# Patient Record
Sex: Female | Born: 1935 | Race: White | Hispanic: No | Marital: Married | Smoking: Former smoker
Health system: Southern US, Community
[De-identification: ages and names within clinical notes are randomized; demographics above are authoritative.]

## PROBLEM LIST (undated history)

## (undated) DIAGNOSIS — N6489 Other specified disorders of breast: Secondary | ICD-10-CM

## (undated) DIAGNOSIS — N390 Urinary tract infection, site not specified: Secondary | ICD-10-CM

## (undated) DIAGNOSIS — K219 Gastro-esophageal reflux disease without esophagitis: Secondary | ICD-10-CM

## (undated) DIAGNOSIS — Z974 Presence of external hearing-aid: Secondary | ICD-10-CM

## (undated) DIAGNOSIS — J189 Pneumonia, unspecified organism: Secondary | ICD-10-CM

## (undated) DIAGNOSIS — J449 Chronic obstructive pulmonary disease, unspecified: Secondary | ICD-10-CM

## (undated) DIAGNOSIS — J45909 Unspecified asthma, uncomplicated: Secondary | ICD-10-CM

## (undated) DIAGNOSIS — R112 Nausea with vomiting, unspecified: Secondary | ICD-10-CM

## (undated) DIAGNOSIS — Z973 Presence of spectacles and contact lenses: Secondary | ICD-10-CM

## (undated) DIAGNOSIS — R0600 Dyspnea, unspecified: Secondary | ICD-10-CM

## (undated) DIAGNOSIS — Z9889 Other specified postprocedural states: Secondary | ICD-10-CM

## (undated) DIAGNOSIS — C801 Malignant (primary) neoplasm, unspecified: Secondary | ICD-10-CM

## (undated) DIAGNOSIS — Z8489 Family history of other specified conditions: Secondary | ICD-10-CM

## (undated) DIAGNOSIS — M1990 Unspecified osteoarthritis, unspecified site: Secondary | ICD-10-CM

## (undated) HISTORY — PX: ABDOMINAL HYSTERECTOMY: SHX81

## (undated) HISTORY — PX: TONSILLECTOMY: SUR1361

## (undated) HISTORY — PX: APPENDECTOMY: SHX54

## (undated) HISTORY — PX: BREAST SURGERY: SHX581

## (undated) HISTORY — PX: CATARACT EXTRACTION: SUR2

---

## 2016-08-08 ENCOUNTER — Other Ambulatory Visit: Payer: Self-pay | Admitting: Internal Medicine

## 2016-08-08 DIAGNOSIS — R928 Other abnormal and inconclusive findings on diagnostic imaging of breast: Secondary | ICD-10-CM

## 2016-08-17 ENCOUNTER — Ambulatory Visit
Admission: RE | Admit: 2016-08-17 | Discharge: 2016-08-17 | Disposition: A | Discharge status: Home / Self Care | Payer: Medicare Other | Source: Ambulatory Visit | Attending: Internal Medicine | Admitting: Internal Medicine

## 2016-08-17 DIAGNOSIS — R928 Other abnormal and inconclusive findings on diagnostic imaging of breast: Secondary | ICD-10-CM

## 2016-09-29 ENCOUNTER — Ambulatory Visit: Payer: Self-pay | Admitting: General Surgery

## 2016-09-29 DIAGNOSIS — N6489 Other specified disorders of breast: Secondary | ICD-10-CM

## 2016-10-03 ENCOUNTER — Ambulatory Visit: Payer: Self-pay | Admitting: General Surgery

## 2016-10-03 DIAGNOSIS — N6489 Other specified disorders of breast: Secondary | ICD-10-CM

## 2016-10-04 ENCOUNTER — Other Ambulatory Visit: Payer: Self-pay | Admitting: General Surgery

## 2016-10-04 DIAGNOSIS — N6489 Other specified disorders of breast: Secondary | ICD-10-CM

## 2016-10-20 ENCOUNTER — Encounter (HOSPITAL_BASED_OUTPATIENT_CLINIC_OR_DEPARTMENT_OTHER): Payer: Self-pay | Admitting: *Deleted

## 2016-10-25 ENCOUNTER — Encounter (HOSPITAL_COMMUNITY)
Admission: RE | Admit: 2016-10-25 | Discharge: 2016-10-25 | Disposition: A | Discharge status: Home / Self Care | Payer: Medicare Other | Source: Ambulatory Visit | Attending: General Surgery | Admitting: General Surgery

## 2016-10-25 ENCOUNTER — Encounter (HOSPITAL_COMMUNITY): Payer: Self-pay | Admitting: *Deleted

## 2016-10-25 DIAGNOSIS — N6320 Unspecified lump in the left breast, unspecified quadrant: Secondary | ICD-10-CM | POA: Insufficient documentation

## 2016-10-25 DIAGNOSIS — Z79899 Other long term (current) drug therapy: Secondary | ICD-10-CM | POA: Diagnosis not present

## 2016-10-25 DIAGNOSIS — N6489 Other specified disorders of breast: Secondary | ICD-10-CM | POA: Diagnosis present

## 2016-10-25 DIAGNOSIS — Z87891 Personal history of nicotine dependence: Secondary | ICD-10-CM | POA: Diagnosis not present

## 2016-10-25 DIAGNOSIS — Z01812 Encounter for preprocedural laboratory examination: Secondary | ICD-10-CM | POA: Insufficient documentation

## 2016-10-25 HISTORY — DX: Urinary tract infection, site not specified: N39.0

## 2016-10-25 HISTORY — DX: Unspecified osteoarthritis, unspecified site: M19.90

## 2016-10-25 HISTORY — DX: Presence of external hearing-aid: Z97.4

## 2016-10-25 HISTORY — DX: Gastro-esophageal reflux disease without esophagitis: K21.9

## 2016-10-25 HISTORY — DX: Presence of spectacles and contact lenses: Z97.3

## 2016-10-25 HISTORY — DX: Pneumonia, unspecified organism: J18.9

## 2016-10-25 HISTORY — DX: Malignant (primary) neoplasm, unspecified: C80.1

## 2016-10-25 HISTORY — DX: Other specified postprocedural states: Z98.890

## 2016-10-25 HISTORY — DX: Family history of other specified conditions: Z84.89

## 2016-10-25 HISTORY — DX: Other specified disorders of breast: N64.89

## 2016-10-25 HISTORY — DX: Nausea with vomiting, unspecified: R11.2

## 2016-10-25 LAB — CBC
HEMATOCRIT: 37.8 % (ref 36.0–46.0)
Hemoglobin: 12.2 g/dL (ref 12.0–15.0)
MCH: 30.5 pg (ref 26.0–34.0)
MCHC: 32.3 g/dL (ref 30.0–36.0)
MCV: 94.5 fL (ref 78.0–100.0)
Platelets: 305 10*3/uL (ref 150–400)
RBC: 4 MIL/uL (ref 3.87–5.11)
RDW: 14.4 % (ref 11.5–15.5)
WBC: 6.6 10*3/uL (ref 4.0–10.5)

## 2016-10-25 NOTE — Pre-Procedure Instructions (Signed)
    Salimah Ottum  10/25/2016      [facility] - [place], [place] - [address] [place] [address] Phone: [phone] Fax: [phone]    Your procedure is scheduled on Friday, October 27, 2016  Report to [facility] Admitting at 5:30 A.M.  Call this number if you have problems the morning of surgery:  [phone]   Remember:  Do not eat food or drink liquids after midnight Thursday, October 26, 2016  Take these medicines the morning of surgery with A SIP OF WATER : zafirlukast (ACCOLATE),  Tiotropium Bromide Monohydrate, budesonide-formoterol (SYMBICORT), cycloSPORINE (RESTASIS) eye drops, if needed: albuterol (PROVENTIL HFA;VENTOLIN HFA)  Inhaler for wheezing or shortness of breath ( Bring inhaler in with you on day of surgery) Stop taking Aspirin, vitamins, fish oil and herbal medications (GLUCOSAMINE-CHONDROITIN, Juvenon, RESVERATROL ).  Do not take any NSAIDs ie: Ibuprofen, Advil, Naproxen, BC and Goody Powder or any medication containing Aspirin; stop now.  Do not wear jewelry, make-up or nail polish.  Do not wear lotions, powders, or perfumes, or deoderant.  Do not shave 48 hours prior to surgery.    Do not bring valuables to the hospital.  [facility] is not responsible for any belongings or valuables.  Contacts, dentures or bridgework may not be worn into surgery.  Leave your suitcase in the car.  After surgery it may be brought to your room. For patients admitted to the hospital, discharge time will be determined by your treatment team. Patients discharged the day of surgery will not be allowed to drive home.  Special instructions:Shower the night before surgery and the morning of surgery with CHG. Please read over the following fact sheets that you were given. Pain Booklet, Coughing and Deep Breathing and Surgical Site Infection Prevention

## 2016-10-25 NOTE — Progress Notes (Signed)
Pt denies SOB, chest pain, and being under the care of a cardiologist. Pt denies having a cardiac cath and echo but stated that a stress test was performed > 25 years ago. Pt denies having an EKG within the last year. Pt denies having recent labs.

## 2016-10-26 ENCOUNTER — Ambulatory Visit
Admission: RE | Admit: 2016-10-26 | Discharge: 2016-10-26 | Disposition: A | Discharge status: Home / Self Care | Payer: Medicare Other | Source: Ambulatory Visit | Attending: General Surgery | Admitting: General Surgery

## 2016-10-26 DIAGNOSIS — N6489 Other specified disorders of breast: Secondary | ICD-10-CM

## 2016-10-26 NOTE — H&P (Signed)
History of Present Illness Marland Kitchen T. Karlina Suares MD; 09/29/2016 10:17 AM) The patient is a 83 year old female who presents with a complaint of Breast problems. She is very pleasant 83 year old female referred by Dr. Curlene Dolphin for a recent large core needle biopsy of the left breast showing a complex sclerosing lesion. Patient states she has a history of fibrocystic breast disease having had some cysts in the left breast aspirated many years ago and possibly an excision as well. No significant family history of breast cancer. She recently presented for screening mammogram. This revealed a possible area of architectural distortion in the lower inner quadrant of the left breast. Diagnostic mammogram was performed confirming an area of distortion. Her mammograms were in [place] and we do not have copies of them currently. However stereotactic biopsy was performed of the area of architectural distortion in the anterior third lower inner quadrant of the left breast. Pathology revealed a complex sclerosing lesion with usual ductal hyperplasia. She is referred to consider excisional biopsy.   Past Surgical History Patsey Berthold, [place]; 09/29/2016 9:53 AM) Appendectomy  Breast Biopsy  Left. Cataract Surgery  Bilateral. Colon Polyp Removal - Colonoscopy  Hysterectomy (not due to cancer) - Complete  Tonsillectomy   Diagnostic Studies History Patsey Berthold, [place]; 09/29/2016 9:53 AM) Colonoscopy  5-10 years ago Mammogram  within last year  Allergies Patsey Berthold, [place]; 09/29/2016 9:53 AM) No Known Drug Allergies 09/29/2016  Medication History Patsey Berthold, CMA; 09/29/2016 9:56 AM) Stann Ore Respimat (2.5MCG/ACT Aerosol Soln, Inhalation) Active. Symbicort (160-4.5MCG/ACT Aerosol, Inhalation) Active. Restasis (0.05% Emulsion, Ophthalmic) Active. Zafirlukast (20MG  Tablet, Oral) Active. Simvastatin (40MG  Tablet, Oral) Active. ProAir HFA (108 (90 Base)MCG/ACT  Aerosol Soln, Inhalation) Active. Zolpidem Tartrate (5MG  Tablet, Oral) Active. Ipratropium Bromide (0.03% Solution, Nasal) Active. Centrum Silver (Oral) Active. Vitamin C (500MG  Tablet, Oral) Active. Vitamin D (Cholecalciferol) (1000UNIT Capsule, Oral) Active. Glucosamine Chondroitin Complx (Oral) Active. Resveratrol (100MG  Capsule, 500 mg Oral) Active. Medications Reconciled  Social History Patsey Berthold, [place]; 09/29/2016 9:53 AM) Alcohol use  Occasional alcohol use. Caffeine use  Coffee, Tea. No drug use  Tobacco use  Former smoker.  Family History Patsey Berthold, [place]; 09/29/2016 9:53 AM) Anesthetic complications  Mother. Arthritis  Mother. Cerebrovascular Accident  Mother. Diabetes Mellitus  Mother. Heart Disease  Father. Ischemic Bowel Disease  Sister.  Pregnancy / Birth History Patsey Berthold, [place]; 09/29/2016 9:53 AM) Age at menarche  50 years. Age of menopause  8-60 Contraceptive History  Oral contraceptives. Gravida  3 Length (months) of breastfeeding  3-6 Maternal age  58-25 Para  3  Other Problems Patsey Berthold, [place]; 09/29/2016 9:53 AM) Arthritis  Asthma  Cancer  Chronic Obstructive Lung Disease  General anesthesia - complications  Home Oxygen Use  Oophorectomy     Review of Systems Patsey Berthold CMA; 09/29/2016 9:53 AM) General Not Present- Appetite Loss, Chills, Fatigue, Fever, Night Sweats, Weight Gain and Weight Loss. Skin Present- Dryness. Not Present- Change in Wart/Mole, Hives, Jaundice, New Lesions, Non-Healing Wounds, Rash and Ulcer. HEENT Present- Hearing Loss, Seasonal Allergies and Wears glasses/contact lenses. Not Present- Earache, Hoarseness, Nose Bleed, Oral Ulcers, Ringing in the Ears, Sinus Pain, Sore Throat, Visual Disturbances and Yellow Eyes. Respiratory Present- Snoring and Wheezing. Not Present- Bloody sputum, Chronic Cough and Difficulty Breathing. Breast Not Present- Breast  Mass, Breast Pain, Nipple Discharge and Skin Changes. Cardiovascular Present- Leg Cramps and Shortness of Breath. Not Present- Chest Pain, Difficulty Breathing Lying Down, Palpitations, Rapid Heart Rate and Swelling of Extremities. Gastrointestinal Not Present- Abdominal  Pain, Bloating, Bloody Stool, Change in Bowel Habits, Chronic diarrhea, Constipation, Difficulty Swallowing, Excessive gas, Gets full quickly at meals, Hemorrhoids, Indigestion, Nausea, Rectal Pain and Vomiting. Female Genitourinary Present- Nocturia and Urgency. Not Present- Frequency, Painful Urination and Pelvic Pain. Neurological Present- Decreased Memory. Not Present- Fainting, Headaches, Numbness, Seizures, Tingling, Tremor, Trouble walking and Weakness. Psychiatric Not Present- Anxiety, Bipolar, Change in Sleep Pattern, Depression, Fearful and Frequent crying. Endocrine Not Present- Cold Intolerance, Excessive Hunger, Hair Changes, Heat Intolerance, Hot flashes and New Diabetes. Hematology Present- Easy Bruising. Not Present- Blood Thinners, Excessive bleeding, Gland problems, HIV and Persistent Infections.  Vitals Patsey Berthold CMA; 09/29/2016 9:56 AM) 09/29/2016 9:56 AM Weight: 170 lb Height: 65in Height was reported by patient. Body Surface Area: 1.85 m Body Mass Index: 28.29 kg/m  Temp.: 69F  Pulse: 86 (Regular)  BP: 138/82 (Sitting, Left Arm, Standard)       Physical Exam Marland Kitchen T. Knute Mazzuca MD; 09/29/2016 10:23 AM) The physical exam findings are as follows: Note:General: Alert, well-developed and well nourished elderly Caucasian female, in no distress Skin: Warm and dry without rash or infection. HEENT: No palpable masses or thyromegaly. Sclera nonicteric. Lymph nodes: No cervical, supraclavicular, nodes palpable. Lungs: Breath sounds clear and equal. No wheezing or increased work of breathing. Cardiovascular: Regular rate and rhythm without murmer. No JVD or  edema. Extremities: No edema or joint swelling or deformity. No chronic venous stasis changes. Neurologic: Alert and fully oriented. Gait normal. No focal weakness. Psychiatric: Normal mood and affect. Thought content appropriate with normal judgement and insight    Assessment & Plan Marland Kitchen T. Eriel Dunckel MD; 09/29/2016 10:31 AM) RADIAL SCAR OF BREAST (N64.89) Impression: Radial scar or a complex sclerosing lesion of left breast on recent large core needle biopsy in area of architectural distortion lower inner left breast. I discussed the finding and implications with the patient and her husband today. We discussed that there is a small, approximately 10-15% risk of underlying early malignancy. I discussed options of excisional biopsy to rule out malignancy versus close clinical follow-up. After discussion there would like to proceed with excisional biopsy. I discussed the nature of surgery and expected recovery as well as risks of anesthetic complications, bleeding, infection and possible further therapy if we did discover a malignancy. All questions were answered. Current Plans Radioactive seed localized left breast lumpectomy under general anesthesia as an outpatient.

## 2016-10-27 ENCOUNTER — Ambulatory Visit (HOSPITAL_COMMUNITY): Payer: Medicare Other | Admitting: Certified Registered"

## 2016-10-27 ENCOUNTER — Encounter (HOSPITAL_COMMUNITY): Payer: Self-pay | Admitting: *Deleted

## 2016-10-27 ENCOUNTER — Ambulatory Visit
Admission: RE | Admit: 2016-10-27 | Discharge: 2016-10-27 | Disposition: A | Discharge status: Home / Self Care | Payer: Medicare Other | Source: Ambulatory Visit | Attending: General Surgery | Admitting: General Surgery

## 2016-10-27 ENCOUNTER — Ambulatory Visit (HOSPITAL_COMMUNITY)
Admission: RE | Admit: 2016-10-27 | Discharge: 2016-10-27 | Disposition: A | Discharge status: Home / Self Care | Payer: Medicare Other | Source: Ambulatory Visit | Attending: General Surgery | Admitting: General Surgery

## 2016-10-27 ENCOUNTER — Encounter (HOSPITAL_COMMUNITY)
Admission: RE | Disposition: A | Discharge status: Home / Self Care | Payer: Self-pay | Source: Ambulatory Visit | Attending: General Surgery

## 2016-10-27 DIAGNOSIS — Z87891 Personal history of nicotine dependence: Secondary | ICD-10-CM | POA: Insufficient documentation

## 2016-10-27 DIAGNOSIS — N6489 Other specified disorders of breast: Secondary | ICD-10-CM | POA: Diagnosis not present

## 2016-10-27 DIAGNOSIS — Z79899 Other long term (current) drug therapy: Secondary | ICD-10-CM | POA: Diagnosis not present

## 2016-10-27 HISTORY — PX: BREAST LUMPECTOMY WITH RADIOACTIVE SEED LOCALIZATION: SHX6424

## 2016-10-27 HISTORY — DX: Unspecified asthma, uncomplicated: J45.909

## 2016-10-27 HISTORY — DX: Chronic obstructive pulmonary disease, unspecified: J44.9

## 2016-10-27 HISTORY — DX: Dyspnea, unspecified: R06.00

## 2016-10-27 SURGERY — BREAST LUMPECTOMY WITH RADIOACTIVE SEED LOCALIZATION
Anesthesia: General | Site: Breast | Laterality: Left

## 2016-10-27 MED ORDER — FENTANYL CITRATE (PF) 100 MCG/2ML IJ SOLN
INTRAMUSCULAR | Status: DC | PRN
  Administered 2016-10-27 (×2): 50 ug via INTRAVENOUS

## 2016-10-27 MED ORDER — CHLORHEXIDINE GLUCONATE CLOTH 2 % EX PADS: 6.0000 | MEDICATED_PAD | Freq: Once | CUTANEOUS | Status: DC

## 2016-10-27 MED ORDER — PROPOFOL 10 MG/ML IV BOLUS
INTRAVENOUS | Status: DC | PRN
  Administered 2016-10-27: 150 mg via INTRAVENOUS

## 2016-10-27 MED ORDER — CEFAZOLIN SODIUM-DEXTROSE 2-4 GM/100ML-% IV SOLN
2.0000 g | INTRAVENOUS | Status: AC
  Administered 2016-10-27: 2 g via INTRAVENOUS
  Filled 2016-10-27: qty 100

## 2016-10-27 MED ORDER — GABAPENTIN 300 MG PO CAPS
300.0000 mg | ORAL_CAPSULE | ORAL | Status: AC
  Administered 2016-10-27: 300 mg via ORAL
  Filled 2016-10-27: qty 1

## 2016-10-27 MED ORDER — ONDANSETRON HCL 4 MG/2ML IJ SOLN
INTRAMUSCULAR | Status: DC | PRN
  Administered 2016-10-27: 4 mg via INTRAVENOUS

## 2016-10-27 MED ORDER — ACETAMINOPHEN 500 MG PO TABS
1000.0000 mg | ORAL_TABLET | ORAL | Status: AC
  Administered 2016-10-27: 1000 mg via ORAL
  Filled 2016-10-27: qty 2

## 2016-10-27 MED ORDER — CELECOXIB 200 MG PO CAPS
400.0000 mg | ORAL_CAPSULE | ORAL | Status: AC
  Administered 2016-10-27: 400 mg via ORAL
  Filled 2016-10-27: qty 2

## 2016-10-27 MED ORDER — 0.9 % SODIUM CHLORIDE (POUR BTL) OPTIME
TOPICAL | Status: DC | PRN
  Administered 2016-10-27: 1000 mL

## 2016-10-27 MED ORDER — BUPIVACAINE HCL (PF) 0.25 % IJ SOLN
INTRAMUSCULAR | Status: AC
  Filled 2016-10-27: qty 30

## 2016-10-27 MED ORDER — HYDROMORPHONE HCL 1 MG/ML IJ SOLN: 0.2500 mg | INTRAMUSCULAR | Status: DC | PRN

## 2016-10-27 MED ORDER — LIDOCAINE 2% (20 MG/ML) 5 ML SYRINGE
INTRAMUSCULAR | Status: AC
  Filled 2016-10-27: qty 5

## 2016-10-27 MED ORDER — EPHEDRINE SULFATE 50 MG/ML IJ SOLN
INTRAMUSCULAR | Status: DC | PRN
Start: 2016-10-27 — End: 2016-10-27
  Administered 2016-10-27: 10 mg via INTRAVENOUS

## 2016-10-27 MED ORDER — PROPOFOL 10 MG/ML IV BOLUS
INTRAVENOUS | Status: AC
  Filled 2016-10-27: qty 20

## 2016-10-27 MED ORDER — LIDOCAINE HCL (CARDIAC) 20 MG/ML IV SOLN
INTRAVENOUS | Status: DC | PRN
  Administered 2016-10-27: 60 mg via INTRAVENOUS

## 2016-10-27 MED ORDER — DEXAMETHASONE SODIUM PHOSPHATE 10 MG/ML IJ SOLN
INTRAMUSCULAR | Status: DC | PRN
  Administered 2016-10-27: 10 mg via INTRAVENOUS

## 2016-10-27 MED ORDER — LACTATED RINGERS IV SOLN
INTRAVENOUS | Status: DC | PRN
  Administered 2016-10-27: 07:00:00 via INTRAVENOUS

## 2016-10-27 MED ORDER — HYDROCODONE-ACETAMINOPHEN 5-325 MG PO TABS: 1.0000 | ORAL_TABLET | ORAL | 0 refills | Status: AC | PRN

## 2016-10-27 MED ORDER — FENTANYL CITRATE (PF) 100 MCG/2ML IJ SOLN
INTRAMUSCULAR | Status: AC
  Filled 2016-10-27: qty 4

## 2016-10-27 MED ORDER — ROCURONIUM BROMIDE 50 MG/5ML IV SOSY
PREFILLED_SYRINGE | INTRAVENOUS | Status: AC
  Filled 2016-10-27: qty 5

## 2016-10-27 MED ORDER — BUPIVACAINE HCL (PF) 0.25 % IJ SOLN
INTRAMUSCULAR | Status: DC | PRN
  Administered 2016-10-27: 10 mL

## 2016-10-27 SURGICAL SUPPLY — 45 items
APPLIER CLIP 9.375 MED OPEN (MISCELLANEOUS)
BINDER BREAST LRG (GAUZE/BANDAGES/DRESSINGS) IMPLANT
BINDER BREAST XLRG (GAUZE/BANDAGES/DRESSINGS) ×3 IMPLANT
BLADE SURG 15 STRL LF DISP TIS (BLADE) ×1 IMPLANT
BLADE SURG 15 STRL SS (BLADE) ×2
CANISTER SUCT 3000ML PPV (MISCELLANEOUS) IMPLANT
CHLORAPREP W/TINT 26ML (MISCELLANEOUS) ×3 IMPLANT
CLIP APPLIE 9.375 MED OPEN (MISCELLANEOUS) IMPLANT
CLIP TI WIDE RED SMALL 6 (CLIP) ×3 IMPLANT
COVER PROBE W GEL 5X96 (DRAPES) ×3 IMPLANT
COVER SURGICAL LIGHT HANDLE (MISCELLANEOUS) ×3 IMPLANT
DERMABOND ADVANCED (GAUZE/BANDAGES/DRESSINGS) ×2
DERMABOND ADVANCED .7 DNX12 (GAUZE/BANDAGES/DRESSINGS) ×1 IMPLANT
DEVICE DUBIN SPECIMEN MAMMOGRA (MISCELLANEOUS) ×3 IMPLANT
DRAPE CHEST BREAST 15X10 FENES (DRAPES) ×3 IMPLANT
DRAPE SURG 17X23 STRL (DRAPES) ×3 IMPLANT
DRAPE UTILITY XL STRL (DRAPES) ×3 IMPLANT
DRSG PAD ABDOMINAL 8X10 ST (GAUZE/BANDAGES/DRESSINGS) ×3 IMPLANT
ELECT CAUTERY BLADE 6.4 (BLADE) ×3 IMPLANT
ELECT COATED BLADE 2.86 ST (ELECTRODE) ×3 IMPLANT
ELECT REM PT RETURN 9FT ADLT (ELECTROSURGICAL) ×3
ELECTRODE REM PT RTRN 9FT ADLT (ELECTROSURGICAL) ×1 IMPLANT
GLOVE BIOGEL PI IND STRL 8 (GLOVE) ×1 IMPLANT
GLOVE BIOGEL PI INDICATOR 8 (GLOVE) ×2
GLOVE ECLIPSE 7.5 STRL STRAW (GLOVE) ×3 IMPLANT
GOWN STRL REUS W/ TWL LRG LVL3 (GOWN DISPOSABLE) ×1 IMPLANT
GOWN STRL REUS W/ TWL XL LVL3 (GOWN DISPOSABLE) ×1 IMPLANT
GOWN STRL REUS W/TWL LRG LVL3 (GOWN DISPOSABLE) ×2
GOWN STRL REUS W/TWL XL LVL3 (GOWN DISPOSABLE) ×2
KIT BASIN OR (CUSTOM PROCEDURE TRAY) ×3 IMPLANT
KIT MARKER MARGIN INK (KITS) ×3 IMPLANT
NEEDLE HYPO 25X1 1.5 SAFETY (NEEDLE) ×3 IMPLANT
NS IRRIG 1000ML POUR BTL (IV SOLUTION) IMPLANT
PACK SURGICAL SETUP 50X90 (CUSTOM PROCEDURE TRAY) ×3 IMPLANT
PENCIL BUTTON HOLSTER BLD 10FT (ELECTRODE) ×3 IMPLANT
SPONGE LAP 18X18 X RAY DECT (DISPOSABLE) ×3 IMPLANT
SUT MON AB 5-0 PS2 18 (SUTURE) ×3 IMPLANT
SUT VIC AB 3-0 SH 18 (SUTURE) ×3 IMPLANT
SYR BULB 3OZ (MISCELLANEOUS) ×3 IMPLANT
SYR CONTROL 10ML LL (SYRINGE) ×3 IMPLANT
TOWEL OR 17X24 6PK STRL BLUE (TOWEL DISPOSABLE) ×3 IMPLANT
TOWEL OR 17X26 10 PK STRL BLUE (TOWEL DISPOSABLE) ×3 IMPLANT
TUBE CONNECTING 12'X1/4 (SUCTIONS)
TUBE CONNECTING 12X1/4 (SUCTIONS) IMPLANT
YANKAUER SUCT BULB TIP NO VENT (SUCTIONS) IMPLANT

## 2016-10-27 NOTE — Anesthesia Preprocedure Evaluation (Signed)
Anesthesia Evaluation  Patient identified by MRN, date of birth, ID band Patient awake    Reviewed: Allergy & Precautions  History of Anesthesia Complications (+) PONV  Airway Mallampati: II  TM Distance: >3 FB     Dental   Pulmonary shortness of breath, asthma , pneumonia, COPD, former smoker,    breath sounds clear to auscultation       Cardiovascular negative cardio ROS   Rhythm:Regular Rate:Normal     Neuro/Psych    GI/Hepatic Neg liver ROS, GERD  ,  Endo/Other  negative endocrine ROS  Renal/GU      Musculoskeletal   Abdominal   Peds  Hematology   Anesthesia Other Findings   Reproductive/Obstetrics                             Anesthesia Physical Anesthesia Plan  ASA: III  Anesthesia Plan: General   Post-op Pain Management:    Induction: Intravenous  Airway Management Planned: LMA  Additional Equipment:   Intra-op Plan:   Post-operative Plan: Extubation in OR  Informed Consent: I have reviewed the patients History and Physical, chart, labs and discussed the procedure including the risks, benefits and alternatives for the proposed anesthesia with the patient or authorized representative who has indicated his/her understanding and acceptance.   Dental advisory given  Plan Discussed with: CRNA and Anesthesiologist  Anesthesia Plan Comments:         Anesthesia Quick Evaluation

## 2016-10-27 NOTE — OR Nursing (Signed)
Patient unable to remove wedding band. Informed of risks and potential injuries.  Once in OR suite wrapped tape around finger underneath ring in attempt to prevent possible injury. Surgeon made aware.

## 2016-10-27 NOTE — Interval H&P Note (Signed)
History and Physical Interval Note:  10/27/2016 7:23 AM  Cheryl Berger  has presented today for surgery, with the diagnosis of RADIAL SCAR LEFT BREAST  The various methods of treatment have been discussed with the patient and family. After consideration of risks, benefits and other options for treatment, the patient has consented to  Procedure(s): LEFT BREAST LUMPECTOMY WITH RADIOACTIVE SEED LOCALIZATION (Left) as a surgical intervention .  The patient's history has been reviewed, patient examined, no change in status, stable for surgery.  I have reviewed the patient's chart and labs.  Questions were answered to the patient's satisfaction.     [name]

## 2016-10-27 NOTE — Anesthesia Postprocedure Evaluation (Signed)
Anesthesia Post Note  Patient: Cheryl Berger  Procedure(s) Performed: Procedure(s) (LRB): LEFT BREAST LUMPECTOMY WITH RADIOACTIVE SEED LOCALIZATION (Left)  Patient location during evaluation: PACU Anesthesia Type: General Level of consciousness: awake Pain management: pain level controlled Respiratory status: spontaneous breathing Cardiovascular status: stable Anesthetic complications: no       Last Vitals:  Vitals:   10/27/16 0853 10/27/16 0859  BP:  136/60  Pulse:  73  Resp:    Temp: 36.5 C     Last Pain:  Vitals:   10/27/16 0613  TempSrc: Oral                 Haskel Dewalt

## 2016-10-27 NOTE — Anesthesia Procedure Notes (Signed)
Procedure Name: LMA Insertion Date/Time: 10/27/2016 7:38 AM Performed by: Lance Coon Pre-anesthesia Checklist: Patient identified, Emergency Drugs available, Suction available, Timeout performed and Patient being monitored Patient Re-evaluated:Patient Re-evaluated prior to inductionOxygen Delivery Method: Circle system utilized Preoxygenation: Pre-oxygenation with 100% oxygen Intubation Type: IV induction LMA: LMA inserted LMA Size: 4.0 Number of attempts: 1 Placement Confirmation: positive ETCO2 and breath sounds checked- equal and bilateral Tube secured with: Tape Dental Injury: Teeth and Oropharynx as per pre-operative assessment

## 2016-10-27 NOTE — Transfer of Care (Signed)
Immediate Anesthesia Transfer of Care Note  Patient: Cheryl Berger  Procedure(s) Performed: Procedure(s): LEFT BREAST LUMPECTOMY WITH RADIOACTIVE SEED LOCALIZATION (Left)  Patient Location: PACU  Anesthesia Type:General  Level of Consciousness: awake, alert  and patient cooperative  Airway & Oxygen Therapy: Patient Spontanous Breathing  Post-op Assessment: Report given to RN and Post -op Vital signs reviewed and stable  Post vital signs: Reviewed and stable  Last Vitals:  Vitals:   10/27/16 0613 10/27/16 0824  BP: (!) 143/73   Pulse: 74 90  Resp: 20 (!) 29  Temp: 36.8 C 36.6 C    Last Pain:  Vitals:   10/27/16 0613  TempSrc: Oral      Patients Stated Pain Goal: 3 ([id number])  Complications: No apparent anesthesia complications

## 2016-10-27 NOTE — Discharge Instructions (Signed)
[facility] °Office Phone Number [phone] ° °BREAST BIOPSY/ PARTIAL MASTECTOMY: POST OP INSTRUCTIONS ° °Always review your discharge instruction sheet given to you by the facility where your surgery was performed. ° °IF YOU HAVE DISABILITY OR FAMILY LEAVE FORMS, YOU MUST BRING THEM TO THE OFFICE FOR PROCESSING.  DO NOT GIVE THEM TO YOUR DOCTOR. ° °1. A prescription for pain medication may be given to you upon discharge.  Take your pain medication as prescribed, if needed.  If narcotic pain medicine is not needed, then you may take acetaminophen (Tylenol) or ibuprofen (Advil) as needed. °2. Take your usually prescribed medications unless otherwise directed °3. If you need a refill on your pain medication, please contact your pharmacy.  They will contact our office to request authorization.  Prescriptions will not be filled after 5pm or on week-ends. °4. You should eat very light the first 24 hours after surgery, such as soup, crackers, pudding, etc.  Resume your normal diet the day after surgery. °5. Most patients will experience some swelling and bruising in the breast.  Ice packs and a good support bra will help.  Swelling and bruising can take several days to resolve.  °6. It is common to experience some constipation if taking pain medication after surgery.  Increasing fluid intake and taking a stool softener will usually help or prevent this problem from occurring.  A mild laxative (Milk of Magnesia or Miralax) should be taken according to package directions if there are no bowel movements after 48 hours. °7. Unless discharge instructions indicate otherwise, you may remove your bandages 24-48 hours after surgery, and you may shower at that time.  You may have steri-strips (small skin tapes) in place directly over the incision.  These strips should be left on the skin for 7-10 days.  If your surgeon used skin glue on the incision, you may shower in 24 hours.  The glue will flake off over the  next 2-3 weeks.  Any sutures or staples will be removed at the office during your follow-up visit. °8. ACTIVITIES:  You may resume regular daily activities (gradually increasing) beginning the next day.  Wearing a good support bra or sports bra minimizes pain and swelling.  You may have sexual intercourse when it is comfortable. °a. You may drive when you no longer are taking prescription pain medication, you can comfortably wear a seatbelt, and you can safely maneuver your car and apply brakes. °b. RETURN TO WORK:  ______________________________________________________________________________________ °9. You should see your doctor in the office for a follow-up appointment approximately two weeks after your surgery.  Your doctor’s nurse will typically make your follow-up appointment when she calls you with your pathology report.  Expect your pathology report 2-3 business days after your surgery.  You may call to check if you do not hear from us after three days. °10. OTHER INSTRUCTIONS: _______________________________________________________________________________________________ _____________________________________________________________________________________________________________________________________ °_____________________________________________________________________________________________________________________________________ °_____________________________________________________________________________________________________________________________________ ° °WHEN TO CALL YOUR DOCTOR: °1. Fever over 101.0 °2. Nausea and/or vomiting. °3. Extreme swelling or bruising. °4. Continued bleeding from incision. °5. Increased pain, redness, or drainage from the incision. ° °The clinic staff is available to answer your questions during regular business hours.  Please don’t hesitate to call and ask to speak to one of the nurses for clinical concerns.  If you have a medical emergency, go to the nearest  emergency room or call 911.  A surgeon from [facility] is always on call at the hospital. ° °For further questions, please visit [email or web address]  °

## 2016-10-27 NOTE — Op Note (Signed)
Preoperative Diagnosis: RADIAL SCAR LEFT BREAST  Postoprative Diagnosis: RADIAL SCAR LEFT BREAST  Procedure: Procedure(s): LEFT BREAST LUMPECTOMY WITH RADIOACTIVE SEED LOCALIZATION   Surgeon: Excell Seltzer T   Assistants: None  Anesthesia:  General LMA anesthesia  Indications: Patient is an 83 year old female with a recent abnormal screening mammogram and subsequent large core needle biopsy revealing a complex sclerosing lesion/radial scar. After consultation detail elsewhere we have elected to proceed with excision to rule out malignancy.    Procedure Detail:  Patient had previously undergone accurate placement of a radioactive seed at the site of the lesion and marking clip. Seed placement was confirmed in the holding area. She was taken to the operating room, placed in the supine position on the operating table, and laryngeal mask general anesthesia induced. The left breast was widely sterilely prepped and draped. She received preoperative IV antibiotics. PAS were placed. Patient timeout was performed and correct procedure verified. The seed was localized with the neoprobe. A circumareolar curvilinear incision was used to dissect carried down through the saphenous tissue toward the breast capsule. As the seed was approached using the neoprobe for guidance and approximately 2 cm globular specimen of breast tissue was excised around the seed. This was inked for margins. Specimen mammogram showed the seed and marking clip centrally located within the specimen. Complete hemostasis was obtained. The soft tissue was infiltrated with Marcaine. Deep breast and subcutaneous tissue was closed with interrupted 3-0 Vicryl and the skin with subcuticular 5-0 Monocryl and Dermabond. Sponge needle and instrument counts were correct.    Findings: As above  Estimated Blood Loss:  Minimal         Drains: None  Blood Given: none          Specimens: Left breast tissue        Complications:  * No  complications entered in OR log *         Disposition: PACU - hemodynamically stable.         Condition: stable

## 2016-10-28 ENCOUNTER — Encounter (HOSPITAL_COMMUNITY): Payer: Self-pay | Admitting: General Surgery

## 2018-04-03 IMAGING — MG STEREOTACTIC CORE NEEDLE BIOPSY
4 series · 4 of 16 positions shown · non-contrast
Comparison: Previous exams.

ADDENDUM:
Pathology revealed COMPLEX SCLEROSING LESION WITH USUAL DUCTAL
HYPERPLASIA, FIBROCYSTIC CHANGES WITH USUAL DUCTAL HYPERPLASIA of
the Left breast, lower inner quadrant. This was found to be
concordant by Dr. Elif Vi, with excision recommended. Pathology
results were discussed with the patient by telephone. The patient
reported doing well after the biopsy with tenderness at the site.
Post biopsy instructions and care were reviewed and questions were
answered. The patient was encouraged to call The [REDACTED] of
consultation has been arranged with Dr. Lorraine Jim at [REDACTED] on September 20, 2016.

Pathology results reported by Duda Krause, RN on 08/18/2016.
CLINICAL DATA: Architectural distortion identified in the slightly
medial and slightly lower left breast on recent screening and
diagnostic mammograms July 2016. The patient is 83 years old
reports that she has had at least one, and possibly more than one
excisional biopsies of the left breast, approximately 40 years ago.
Today, I do detect a healed surgical scar in the outer left breast,
but no scar is identified in the inner left breast.
EXAM:
LEFT BREAST STEREOTACTIC CORE NEEDLE BIOPSY

[L CC]
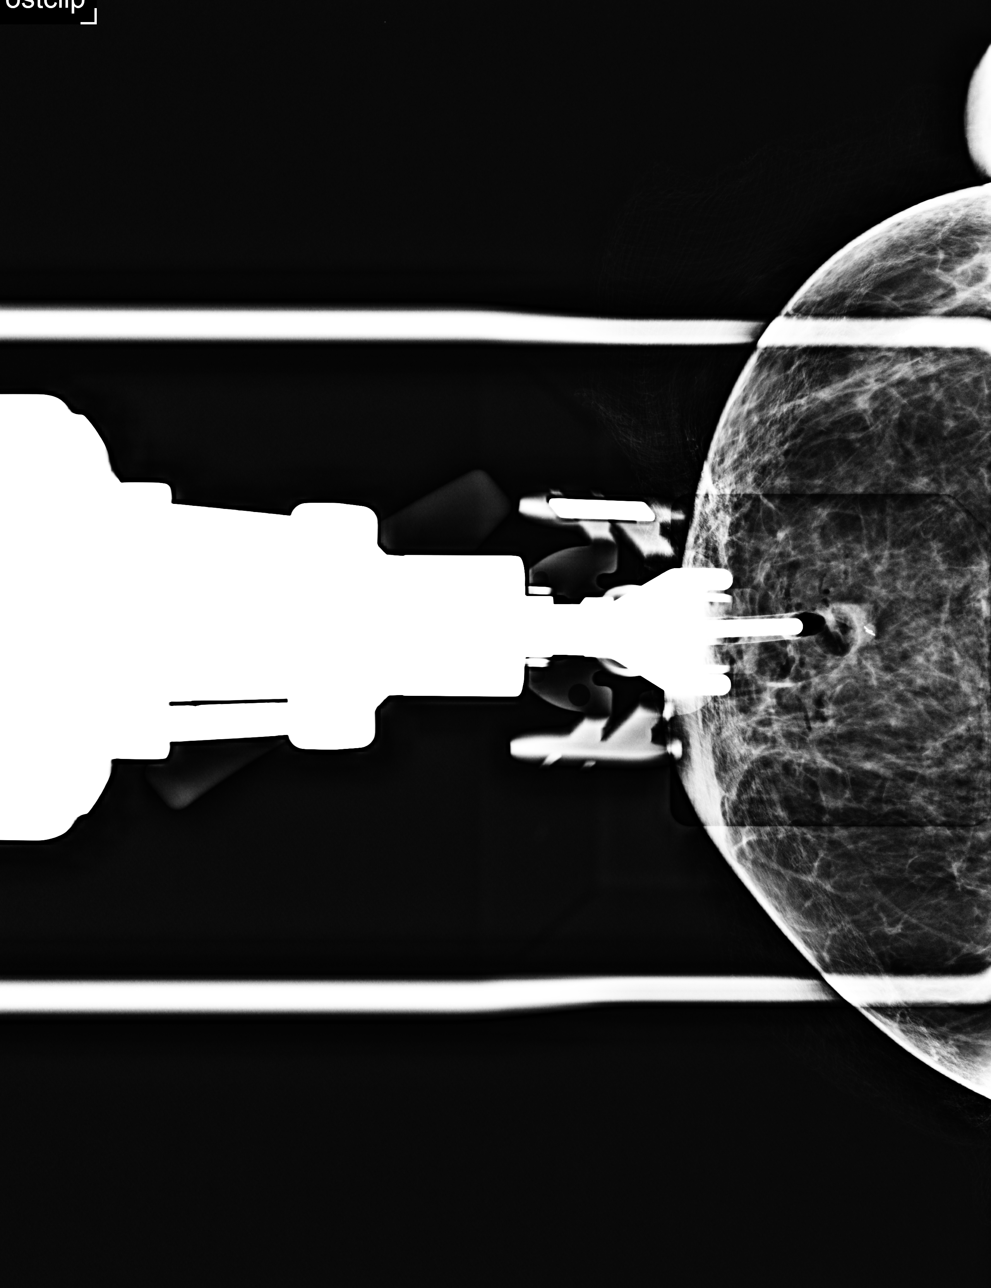

[L CC tomo (1 of 3) · tomo slice 26/51.0]
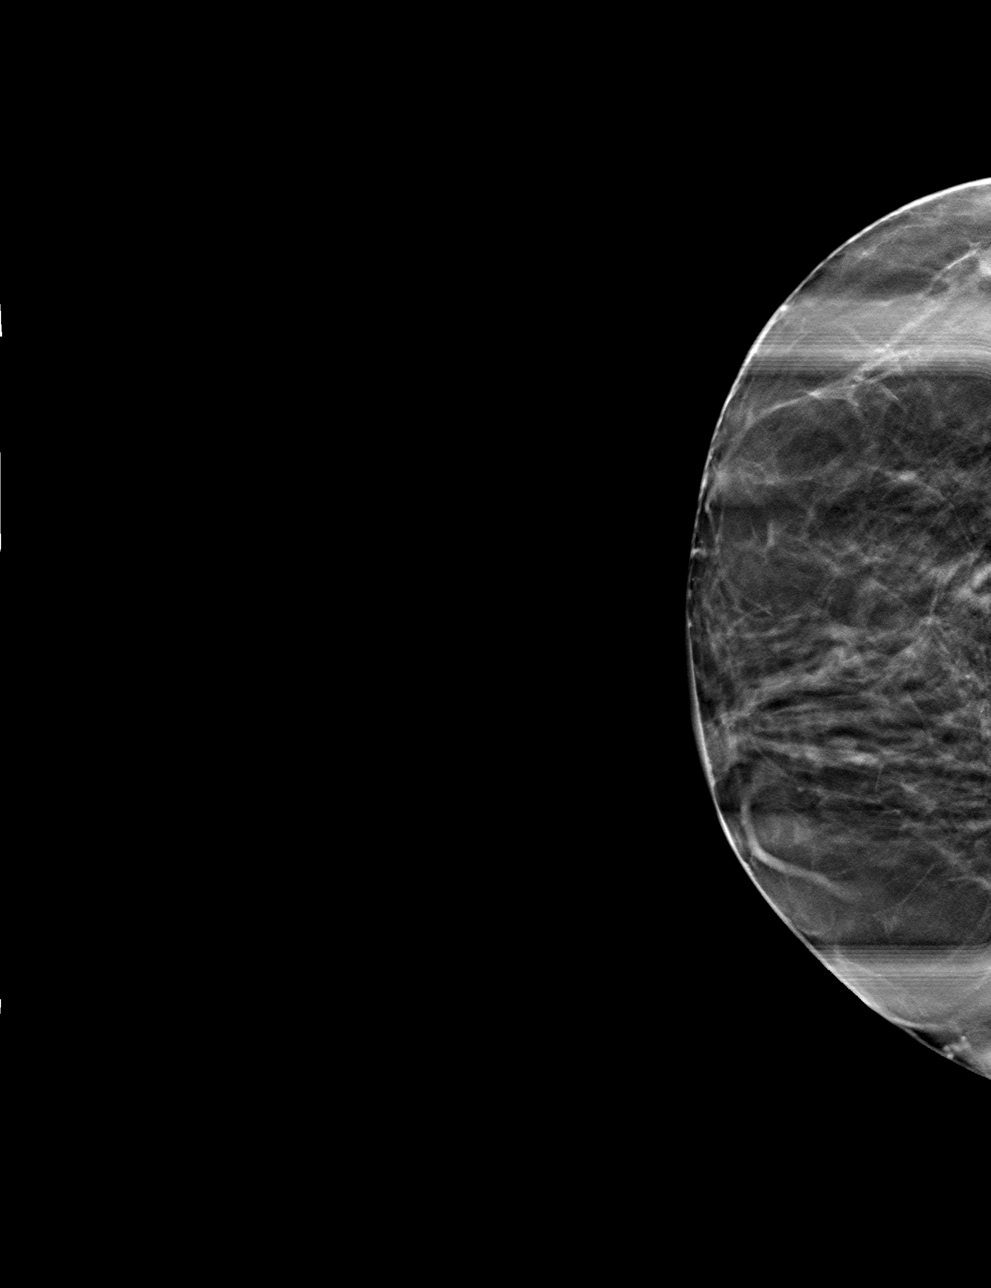

[L CC tomo (2 of 3) · tomo slice 26/51.0]
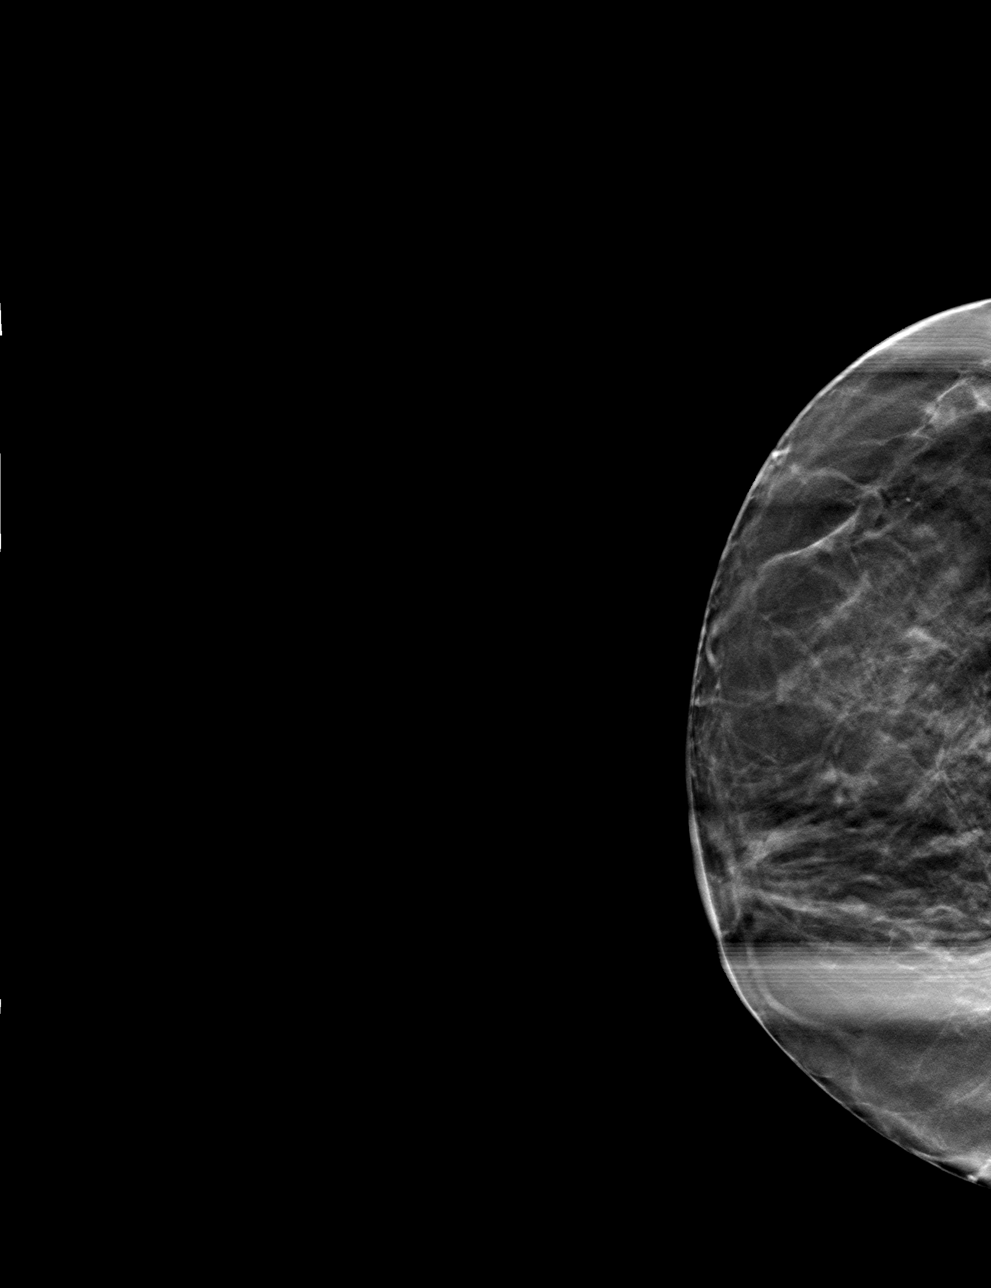

[L CC tomo (3 of 3) · tomo slice 26/51.0]
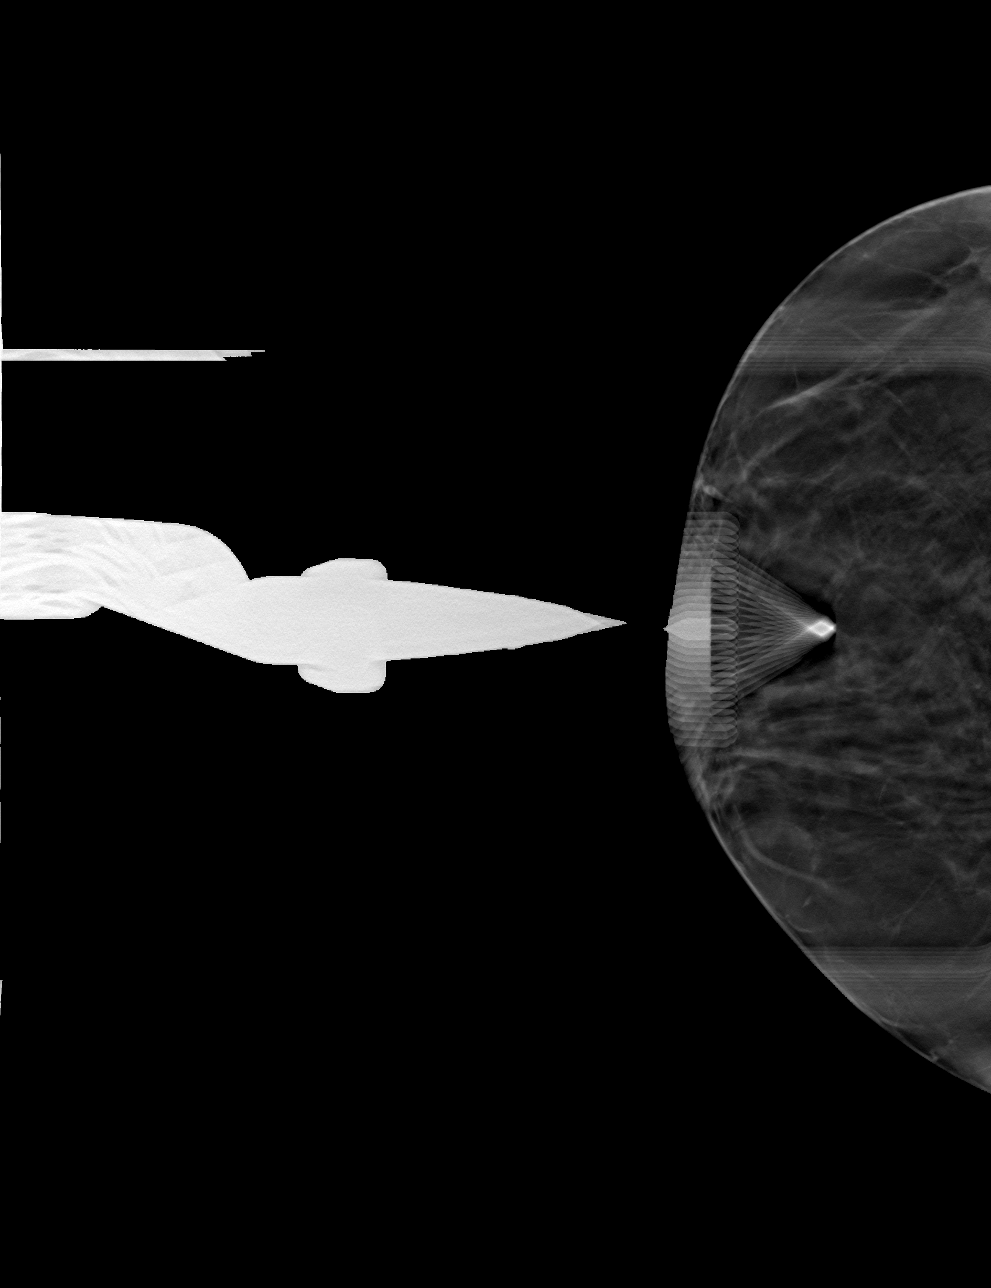

[4 of 16 positions shown; findings below may reference images not displayed]



Using sterile technique and 1% Lidocaine with the without
epinephrine as local anesthetic, under stereotactic guidance, a 9
gauge vacuum assisted device was used to perform core needle biopsy
of architectural distortion in the slightly medial and slightly
inferior left breast, anterior third using a superior to inferior
approach.

At the conclusion of the procedure, an X shaped tissue marker clip
was deployed into the biopsy cavity. Follow-up 2-view mammogram was
performed and dictated separately.
IMPRESSION: Stereotactic-guided biopsy of the left breast. No apparent
complications.

## 2018-04-03 IMAGING — MG MM CLIP PLACEMENT
6 series · 6 of 14 positions shown · non-contrast
Comparison: Previous exam(s).

CLINICAL DATA: Stereotactic biopsy was performed of an area of
architectural distortion in the anterior third of the lower inner
quadrant of the left breast.

EXAM:
DIAGNOSTIC LEFT MAMMOGRAM POST STEREOTACTIC BIOPSY

[L ML]
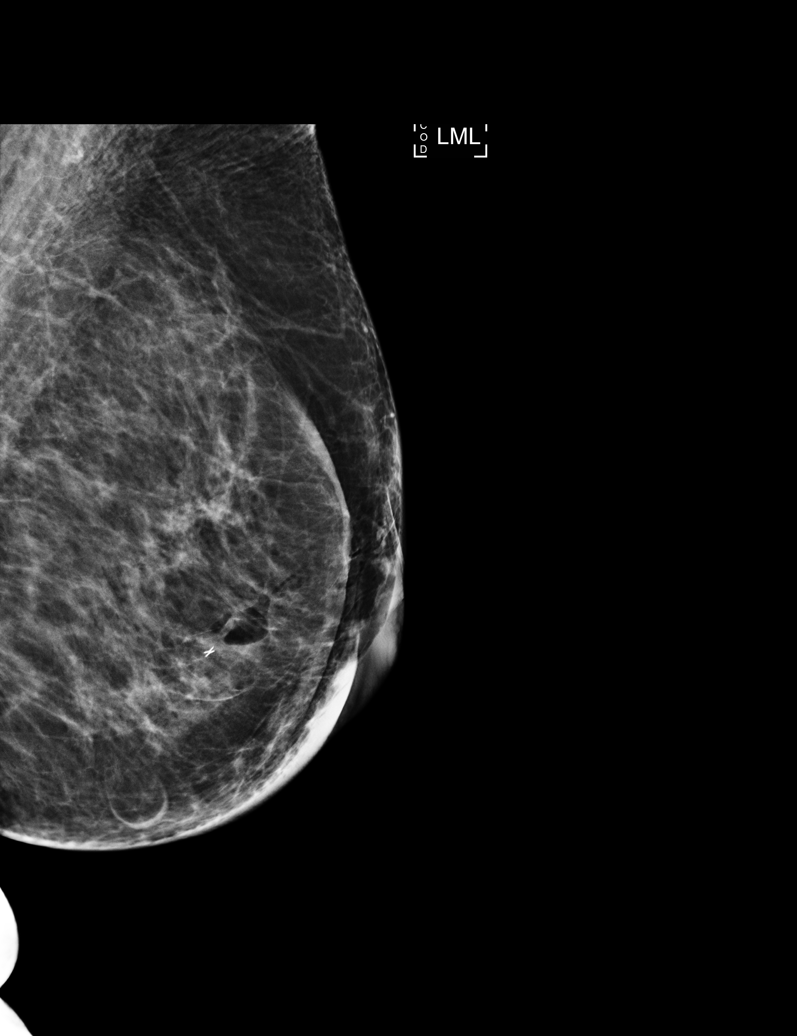

[L ML synth-2D]
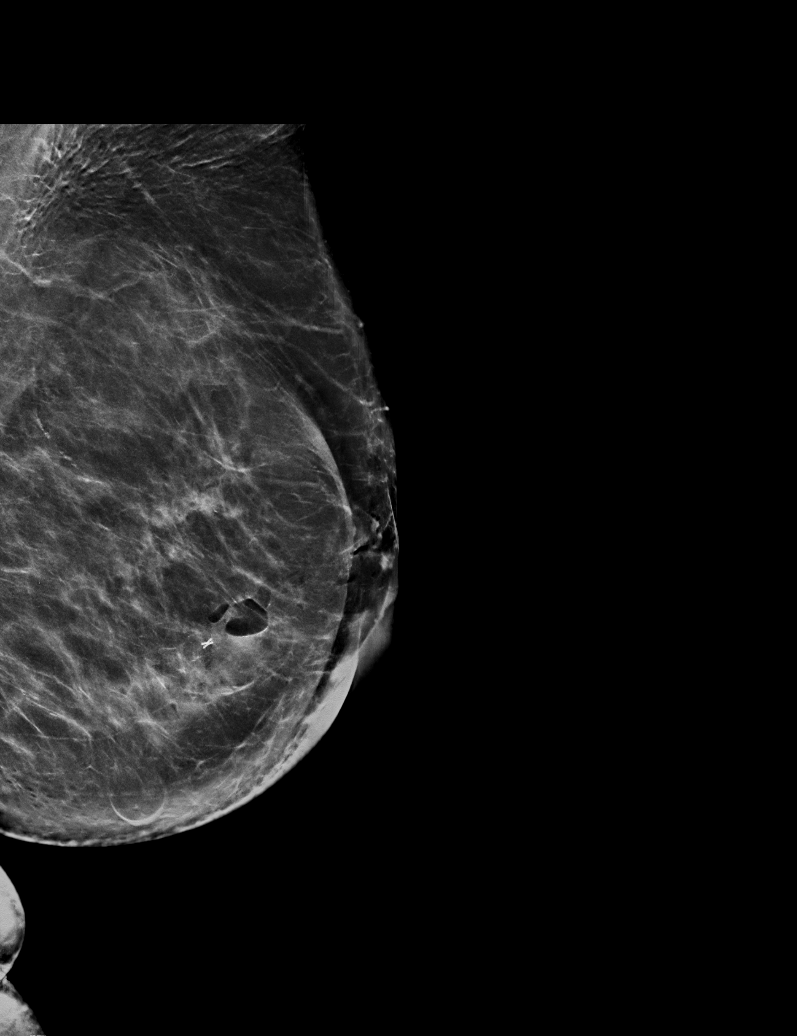

[L CC]
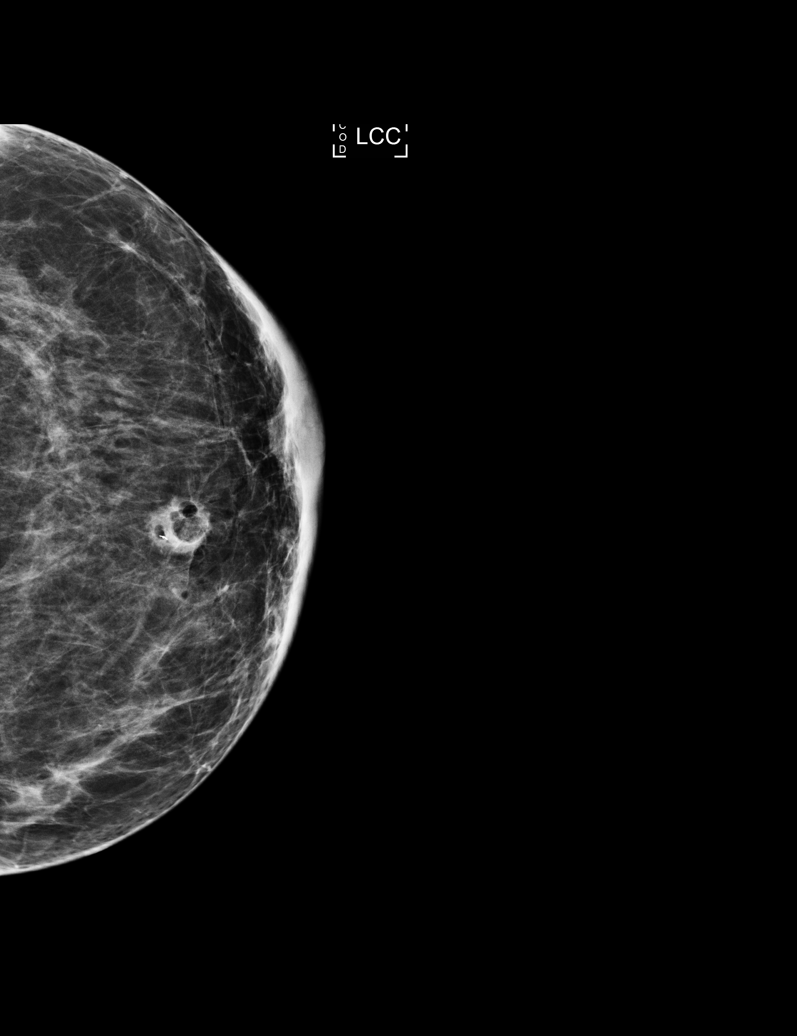

[L CC synth-2D]
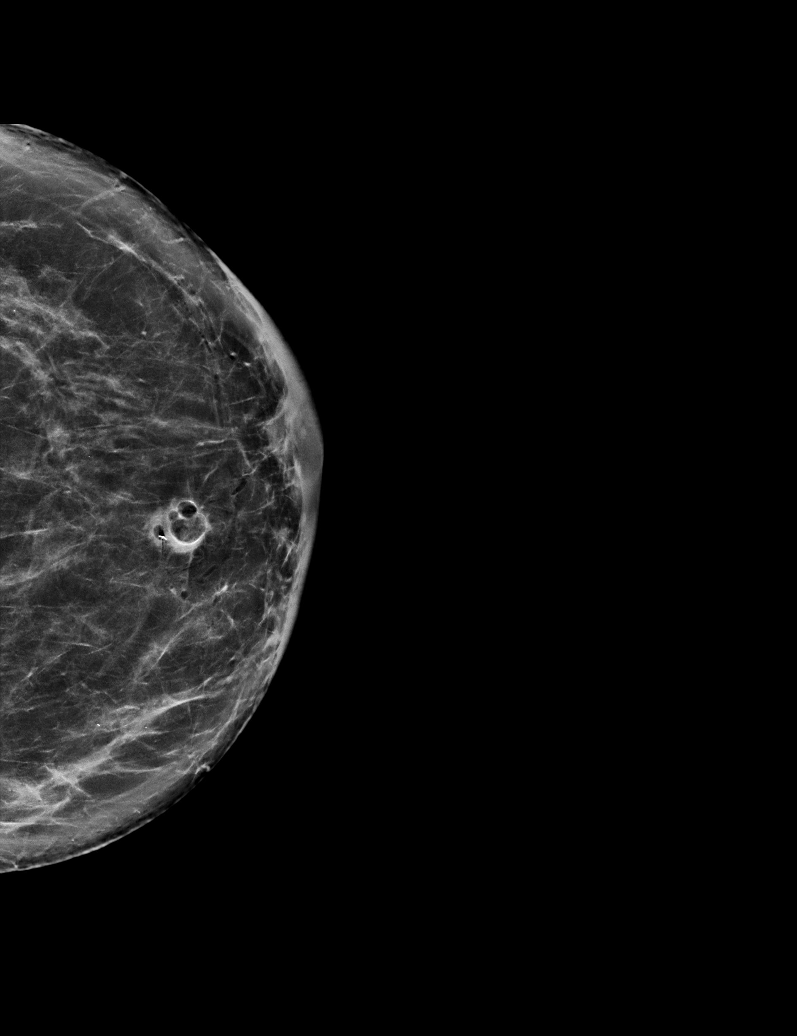

[L ML tomo · tomo slice 43/85.0]
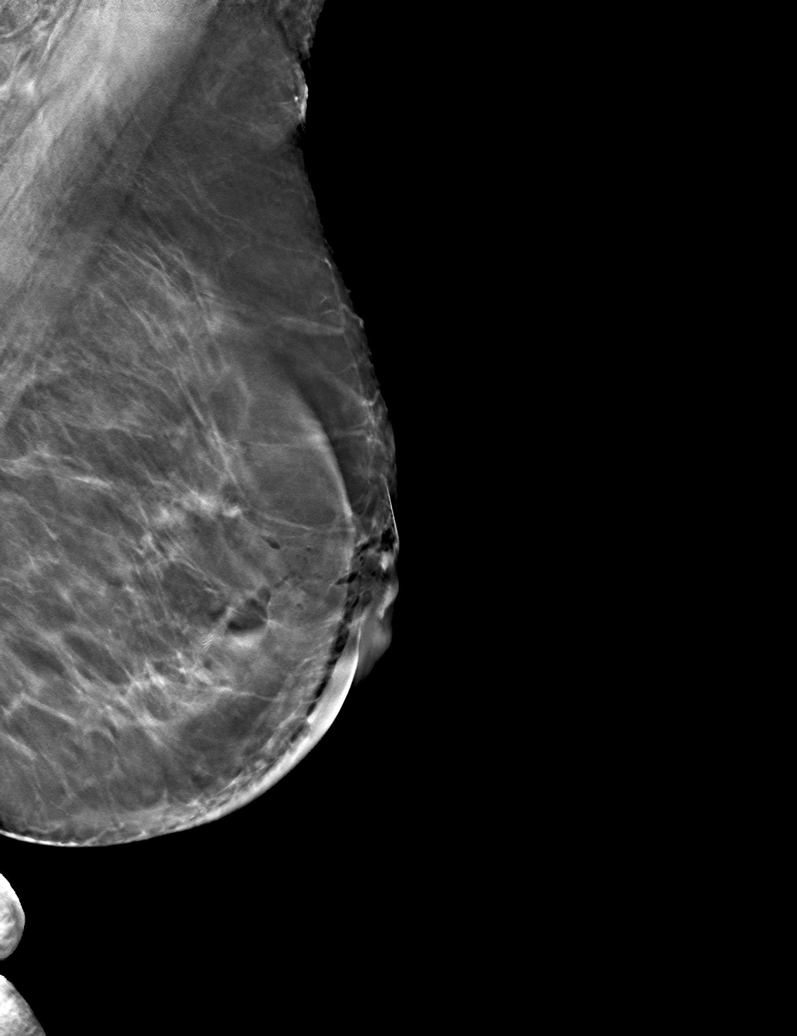

[L CC tomo · tomo slice 37/74.0]
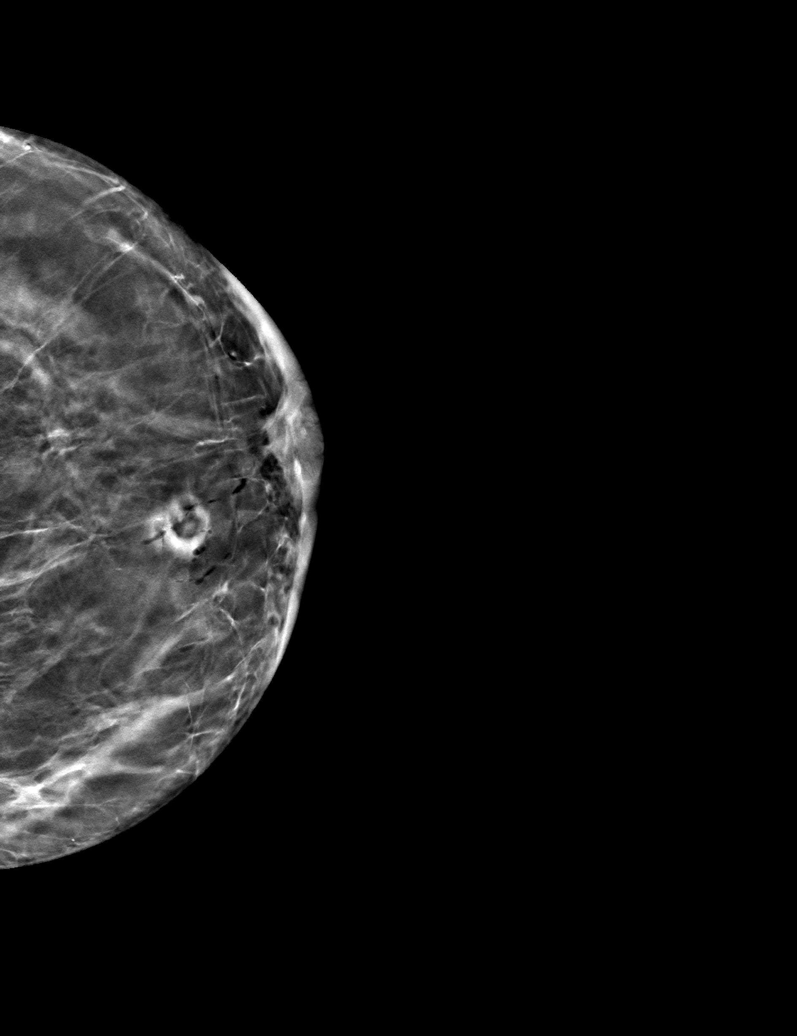

[6 of 14 positions shown; findings below may reference images not displayed]

FINDINGS: Mammographic images were obtained following stereotactic guided
biopsy of architectural distortion in the anterior third of the
lower inner quadrant of the left breast. An X shaped biopsy clip is
satisfactorily positioned in the anterior third of the left breast
within an area of architectural distortion. Post biopsy changes are
noted in the area of architectural distortion. The biopsy clip is in
satisfactory position.
IMPRESSION: Satisfactory position of X shaped biopsy clip.

Final Assessment: Post Procedure Mammograms for Marker Placement
